# Patient Record
Sex: Male | Born: 1990 | Race: Black or African American | Hispanic: No | Marital: Single | Smoking: Never smoker
Health system: Southern US, Community
[De-identification: ages and names within clinical notes are randomized; demographics above are authoritative.]

## PROBLEM LIST (undated history)

## (undated) DIAGNOSIS — R079 Chest pain, unspecified: Secondary | ICD-10-CM

## (undated) DIAGNOSIS — I10 Essential (primary) hypertension: Secondary | ICD-10-CM

## (undated) HISTORY — DX: Essential (primary) hypertension: I10

## (undated) HISTORY — DX: Chest pain, unspecified: R07.9

---

## 2012-11-26 ENCOUNTER — Encounter (HOSPITAL_COMMUNITY): Payer: Self-pay | Admitting: Emergency Medicine

## 2012-11-26 ENCOUNTER — Emergency Department (HOSPITAL_COMMUNITY)
Admission: EM | Admit: 2012-11-26 | Discharge: 2012-11-26 | Disposition: A | Discharge status: Home / Self Care | Payer: BC Managed Care – PPO | Source: Home / Self Care | Attending: Emergency Medicine | Admitting: Emergency Medicine

## 2012-11-26 ENCOUNTER — Emergency Department (INDEPENDENT_AMBULATORY_CARE_PROVIDER_SITE_OTHER): Payer: BC Managed Care – PPO

## 2012-11-26 DIAGNOSIS — J209 Acute bronchitis, unspecified: Secondary | ICD-10-CM

## 2012-11-26 DIAGNOSIS — J208 Acute bronchitis due to other specified organisms: Secondary | ICD-10-CM

## 2012-11-26 MED ORDER — HYDROCOD POLST-CHLORPHEN POLST 10-8 MG/5ML PO LQCR: 5.0000 mL | Freq: Two times a day (BID) | ORAL | Status: DC | PRN

## 2012-11-26 MED ORDER — NAPROXEN 500 MG PO TABS: 500.0000 mg | ORAL_TABLET | Freq: Two times a day (BID) | ORAL | Status: DC

## 2012-11-26 MED ORDER — ALBUTEROL SULFATE HFA 108 (90 BASE) MCG/ACT IN AERS: 1.0000 | INHALATION_SPRAY | Freq: Four times a day (QID) | RESPIRATORY_TRACT | Status: DC | PRN

## 2012-11-26 MED ORDER — PREDNISONE 20 MG PO TABS: 20.0000 mg | ORAL_TABLET | Freq: Two times a day (BID) | ORAL | Status: DC

## 2012-11-26 NOTE — ED Provider Notes (Signed)
Chief Complaint:   Chief Complaint  Patient presents with  . Cough  . Dizziness    History of Present Illness:   Joe Mayer is a 22 year old male who has had a five-day history of cough productive of clear sputum, wheezing, gagging with cough but no vomiting, dizziness when walking or driving, chills, sweats, sneezing, headache, sore throat, and diarrhea with 3-4 loose stools per day. He denies any chest pain, nasal congestion, or vomiting. No sick exposures.  Review of Systems:  Other than noted above, the patient denies any of the following symptoms: Systemic:  No fevers, chills, sweats, weight loss or gain, fatigue, or tiredness. Eye:  No redness or discharge. ENT:  No ear pain, drainage, headache, nasal congestion, drainage, sinus pressure, difficulty swallowing, or sore throat. Neck:  No neck pain or swollen glands. Lungs:  No cough, sputum production, hemoptysis, wheezing, chest tightness, shortness of breath or chest pain. GI:  No abdominal pain, nausea, vomiting or diarrhea.  PMFSH:  Past medical history, family history, social history, meds, and allergies were reviewed. He's had high blood pressure in the past but is not taking any medication for right now.  Physical Exam:   Vital signs:  BP 134/89  Pulse 98  Temp(Src) 98.9 F (37.2 C) (Oral)  Resp 17  SpO2 97% General:  Alert and oriented.  In no distress.  Skin warm and dry. Eye:  No conjunctival injection or drainage. Lids were normal. ENT:  TMs and canals were normal, without erythema or inflammation.  Nasal mucosa was clear and uncongested, without drainage.  Mucous membranes were moist.  Pharynx was clear with no exudate but he does have some clear postnasal drainage.  There were no oral ulcerations or lesions. Neck:  Supple, no adenopathy, tenderness or mass. Lungs:  No respiratory distress.  Lungs were clear to auscultation, without wheezes, rales or rhonchi.  Breath sounds were clear and equal bilaterally.  Heart:   Regular rhythm, without gallops, murmers or rubs. Skin:  Clear, warm, and dry, without rash or lesions.  Radiology:  Dg Chest 2 View  11/26/2012   *RADIOLOGY REPORT*  Clinical Data: Productive cough for 5 days  CHEST - 2 VIEW  Comparison: None.  Findings: The heart, mediastinum and hila are within normal limits. The lungs are clear.  No pleural effusion or pneumothorax.  The bony thorax and surrounding soft tissues are unremarkable.  IMPRESSION: Normal chest radiographs.   Original Report Authenticated By: Amie Portland, M.D.   Assessment:  The encounter diagnosis was Viral bronchitis.  No indication for antibiotics.  Plan:   1.  The following meds were prescribed:   Discharge Medication List as of 11/26/2012  8:30 PM    START taking these medications   Details  albuterol (PROVENTIL HFA;VENTOLIN HFA) 108 (90 BASE) MCG/ACT inhaler Inhale 1-2 puffs into the lungs every 6 (six) hours as needed for wheezing., Starting 11/26/2012, Until Discontinued, Normal    chlorpheniramine-HYDROcodone (TUSSIONEX) 10-8 MG/5ML LQCR Take 5 mL by mouth every 12 (twelve) hours as needed., Starting 11/26/2012, Until Discontinued, Normal    naproxen (NAPROSYN) 500 MG tablet Take 1 tablet (500 mg total) by mouth 2 (two) times daily., Starting 11/26/2012, Until Discontinued, Normal    predniSONE (DELTASONE) 20 MG tablet Take 1 tablet (20 mg total) by mouth 2 (two) times daily., Starting 11/26/2012, Until Discontinued, Normal       2.  The patient was instructed in symptomatic care and handouts were given. 3.  The patient was told to  return if becoming worse in any way, if no better in 3 or 4 days, and given some red flag symptoms such as fever, difficulty breathing, or pain that would indicate earlier return. 4.  Follow up here if no better in 3 or 4 days.      Reuben Likes, MD 11/26/12 [phone]

## 2012-11-26 NOTE — ED Notes (Signed)
C/o cough and dizziness since Friday.  States throughout the day he has SOB.  This morning patient nosed started bleeding so he decided to come on in.  Patient nose has bleed before when younger.   OTC medication taken but no relief.

## 2013-04-29 ENCOUNTER — Encounter (HOSPITAL_COMMUNITY): Payer: Self-pay | Admitting: Emergency Medicine

## 2013-04-29 ENCOUNTER — Emergency Department (HOSPITAL_COMMUNITY)
Admission: EM | Admit: 2013-04-29 | Discharge: 2013-04-29 | Disposition: A | Discharge status: Home / Self Care | Payer: BC Managed Care – PPO | Source: Home / Self Care | Attending: Family Medicine | Admitting: Family Medicine

## 2013-04-29 ENCOUNTER — Emergency Department (INDEPENDENT_AMBULATORY_CARE_PROVIDER_SITE_OTHER): Payer: BC Managed Care – PPO

## 2013-04-29 DIAGNOSIS — J40 Bronchitis, not specified as acute or chronic: Secondary | ICD-10-CM

## 2013-04-29 MED ORDER — ALBUTEROL SULFATE HFA 108 (90 BASE) MCG/ACT IN AERS: 2.0000 | INHALATION_SPRAY | Freq: Four times a day (QID) | RESPIRATORY_TRACT | Status: DC | PRN

## 2013-04-29 MED ORDER — PREDNISONE 10 MG PO TABS: 30.0000 mg | ORAL_TABLET | Freq: Every day | ORAL | Status: DC

## 2013-04-29 MED ORDER — IPRATROPIUM BROMIDE 0.02 % IN SOLN
RESPIRATORY_TRACT | Status: AC
  Filled 2013-04-29: qty 2.5

## 2013-04-29 MED ORDER — IPRATROPIUM-ALBUTEROL 0.5-2.5 (3) MG/3ML IN SOLN
3.0000 mL | Freq: Once | RESPIRATORY_TRACT | Status: AC
  Administered 2013-04-29: 3 mL via RESPIRATORY_TRACT

## 2013-04-29 MED ORDER — ALBUTEROL SULFATE (2.5 MG/3ML) 0.083% IN NEBU
INHALATION_SOLUTION | RESPIRATORY_TRACT | Status: AC
  Filled 2013-04-29: qty 3

## 2013-04-29 NOTE — ED Notes (Signed)
Pt reports  Symptoms  Of  Chest  Wall  Pain  When he  Takes    A  Deep breath            He  Also  Reports  A  Headache      And  Reports  His  bp  Has  Been  High  Recently                 He  Is  Sitting  Upright on the  Exam table  Speaking in  Complete  sentances  And  Is  In no  Acute  Distress

## 2013-04-29 NOTE — ED Provider Notes (Signed)
Joe CornfieldChase Mayer is a 23 y.o. male who presents to Urgent Care today for chest pain. Patient notes 2 days of central chest pain. The pain does not radiate. The pain is worse with deep inspiration and coughing.  The pain is mild and not exertional. He denies any shortness of breath. He does note some central chest congestion that is similar to previous bronchitis. He [place] tub[employer] [place] [employer].  No nausea vomiting or diarrhea.  He additionally notes a two-month headache. He denies any weakness numbness loss of function trouble breathing or swallowing. He notes the pain is present on most days. It is not worse or better with anything. The pain is mild. He has tried multiple over-the-counter medications for both problems which have helped a little.   History reviewed. No pertinent past medical history. History  Substance Use Topics  . Smoking status: Never Smoker   . Smokeless tobacco: Not on file  . Alcohol Use: No   ROS as above Medications: No current facility-administered medications for this encounter.   Current Outpatient Prescriptions  Medication Sig Dispense Refill  . albuterol (PROVENTIL HFA;VENTOLIN HFA) 108 (90 BASE) MCG/ACT inhaler Inhale 2 puffs into the lungs every 6 (six) hours as needed for wheezing or shortness of breath.  1 Inhaler  2  . predniSONE (DELTASONE) 10 MG tablet Take 3 tablets (30 mg total) by mouth daily.  15 tablet  0    Exam:  BP 127/50  Pulse 62  Temp(Src) 98.7 F (37.1 C) (Oral)  Resp 16  SpO2 100% Gen: Well NAD HEENT: EOMI,  MMM, PERRLA Lungs: Normal work of breathing. CTABL Heart: RRR no MRG Chest Wall: Nontender.  Abd: NABS, Soft. NT, ND Exts: Brisk capillary refill, warm and well perfused.  Neuro: Alert and oriented normal coordination strength gait and balance  Patient was given a DuoNeb nebulizer treatment and had considerable improvement in symptoms  No results found for this or any previous visit (from the past 24  hour(s)). Dg Chest 2 View  04/29/2013   CLINICAL DATA:  Chest pain for the past 2 days.  EXAM: CHEST  2 VIEW  COMPARISON:  Chest x-ray 11/26/2012.  FINDINGS: Lung volumes are normal. No consolidative airspace disease. No pleural effusions. No pneumothorax. No pulmonary nodule or mass noted. Pulmonary vasculature and the cardiomediastinal silhouette are within normal limits. Axial No radiographic evidence of acute cardiopulmonary disease.  IMPRESSION: No active cardiopulmonary disease.   Electronically Signed   By: Trudie Reedaniel  Entrikin M.D.   On: 04/29/2013 17:32    Assessment and Plan: 23 y.o. male with bronchitis with pleuritis. Plan to treat with prednisone, and albuterol. Followup with primary care provider.  Patient also has a chronic headache. He has no abnormal neurological symptoms or physical exam findings.  Recommend followup with primary care provider. This may be medication rebound headache.  Discussed warning signs or symptoms. Please see discharge instructions. Patient expresses understanding.    Rodolph BongEvan S Leeandre Nordling, MD 04/29/13 [phone]

## 2013-04-29 NOTE — Discharge Instructions (Signed)
Thank you for coming in today. Take prednisone daily for 5 days. Use albuterol as needed for shortness of breath. Take up to 2 Aleve twice daily. If not getting better please followup back here, or at your primary care office. Followup with your primary care provider for headache. Call or go to the emergency room if you get worse, have trouble breathing, have chest pains, or palpitations.  Go to the emergency room if your headache becomes excruciating or you have weakness or numbness or uncontrolled vomiting.   Bronchitis Bronchitis is inflammation of the airways that extend from the windpipe into the lungs (bronchi). The inflammation often causes mucus to develop, which leads to a cough. If the inflammation becomes severe, it may cause shortness of breath. CAUSES  Bronchitis may be caused by:   Viral infections.   Bacteria.   Cigarette smoke.   Allergens, pollutants, and other irritants.  SIGNS AND SYMPTOMS  The most common symptom of bronchitis is a frequent cough that produces mucus. Other symptoms include:  Fever.   Body aches.   Chest congestion.   Chills.   Shortness of breath.   Sore throat.  DIAGNOSIS  Bronchitis is usually diagnosed through a medical history and physical exam. Tests, such as chest X-rays, are sometimes done to rule out other conditions.  TREATMENT  You may need to avoid contact with whatever caused the problem (smoking, for example). Medicines are sometimes needed. These may include:  Antibiotics. These may be prescribed if the condition is caused by bacteria.  Cough suppressants. These may be prescribed for relief of cough symptoms.   Inhaled medicines. These may be prescribed to help open your airways and make it easier for you to breathe.   Steroid medicines. These may be prescribed for those with recurrent (chronic) bronchitis. HOME CARE INSTRUCTIONS  Get plenty of rest.   Drink enough fluids to keep your urine clear or  pale yellow (unless you have a medical condition that requires fluid restriction). Increasing fluids may help thin your secretions and will prevent dehydration.   Only take over-the-counter or prescription medicines as directed by your health care provider.  Only take antibiotics as directed. Make sure you finish them even if you start to feel better.  Avoid secondhand smoke, irritating chemicals, and strong fumes. These will make bronchitis worse. If you are a smoker, quit smoking. Consider using nicotine gum or skin patches to help control withdrawal symptoms. Quitting smoking will help your lungs heal faster.   Put a cool-mist humidifier in your bedroom at night to moisten the air. This may help loosen mucus. Change the water in the humidifier daily. You can also run the hot water in your shower and sit in the bathroom with the door closed for 5 10 minutes.   Follow up with your health care provider as directed.   Wash your hands frequently to avoid catching bronchitis again or spreading an infection to others.  SEEK MEDICAL CARE IF: Your symptoms do not improve after 1 week of treatment.  SEEK IMMEDIATE MEDICAL CARE IF:  Your fever increases.  You have chills.   You have chest pain.   You have worsening shortness of breath.   You have bloody sputum.  You faint.  You have lightheadedness.  You have a severe headache.   You vomit repeatedly. MAKE SURE YOU:   Understand these instructions.  Will watch your condition.  Will get help right away if you are not doing well or get worse. Document Released:  03/28/2005 Document Revised: 01/16/2013 Document Reviewed: 11/20/2012 [facility] Patient Information 2014 [employer] [place].

## 2014-12-21 IMAGING — CR DG CHEST 2V
3 series · 3 of 3 positions shown · non-contrast
Comparison: None.

CLINICAL DATA: Productive cough for 5 days

CHEST - 2 VIEW

[view not recorded (1 of 3)]
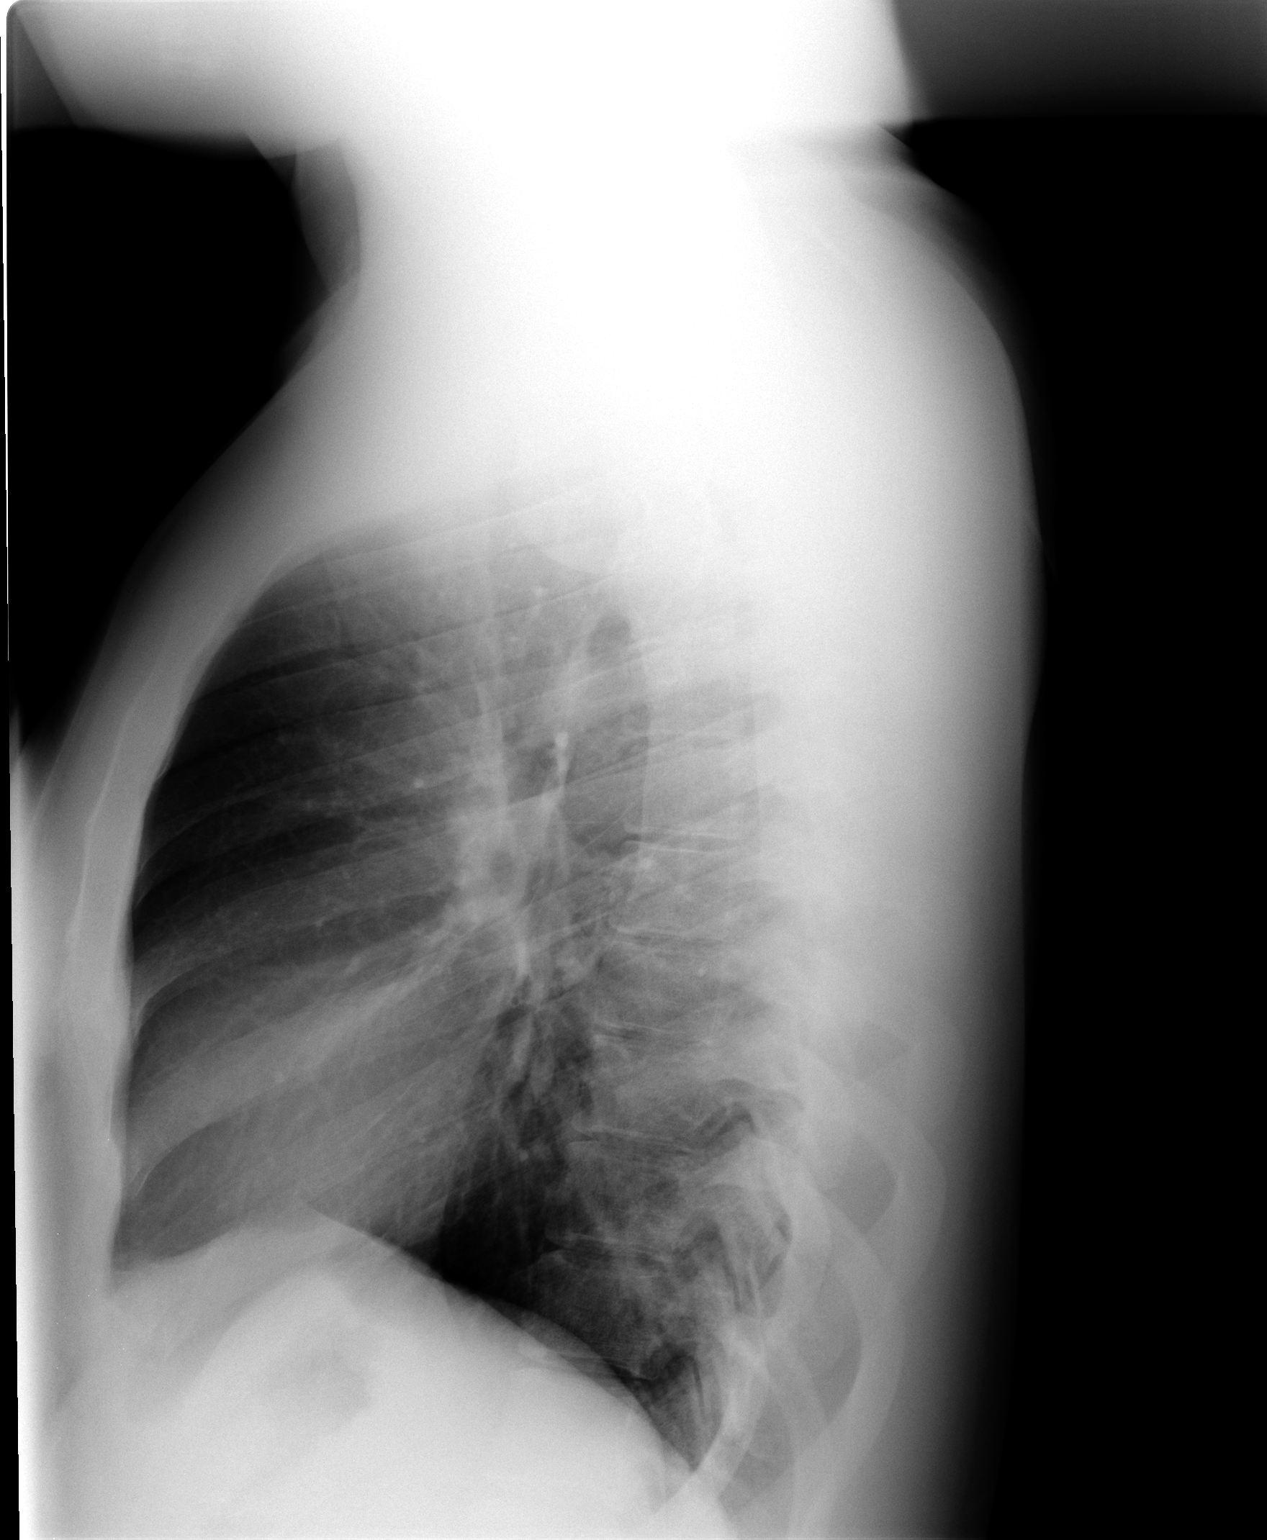

[view not recorded (2 of 3)]
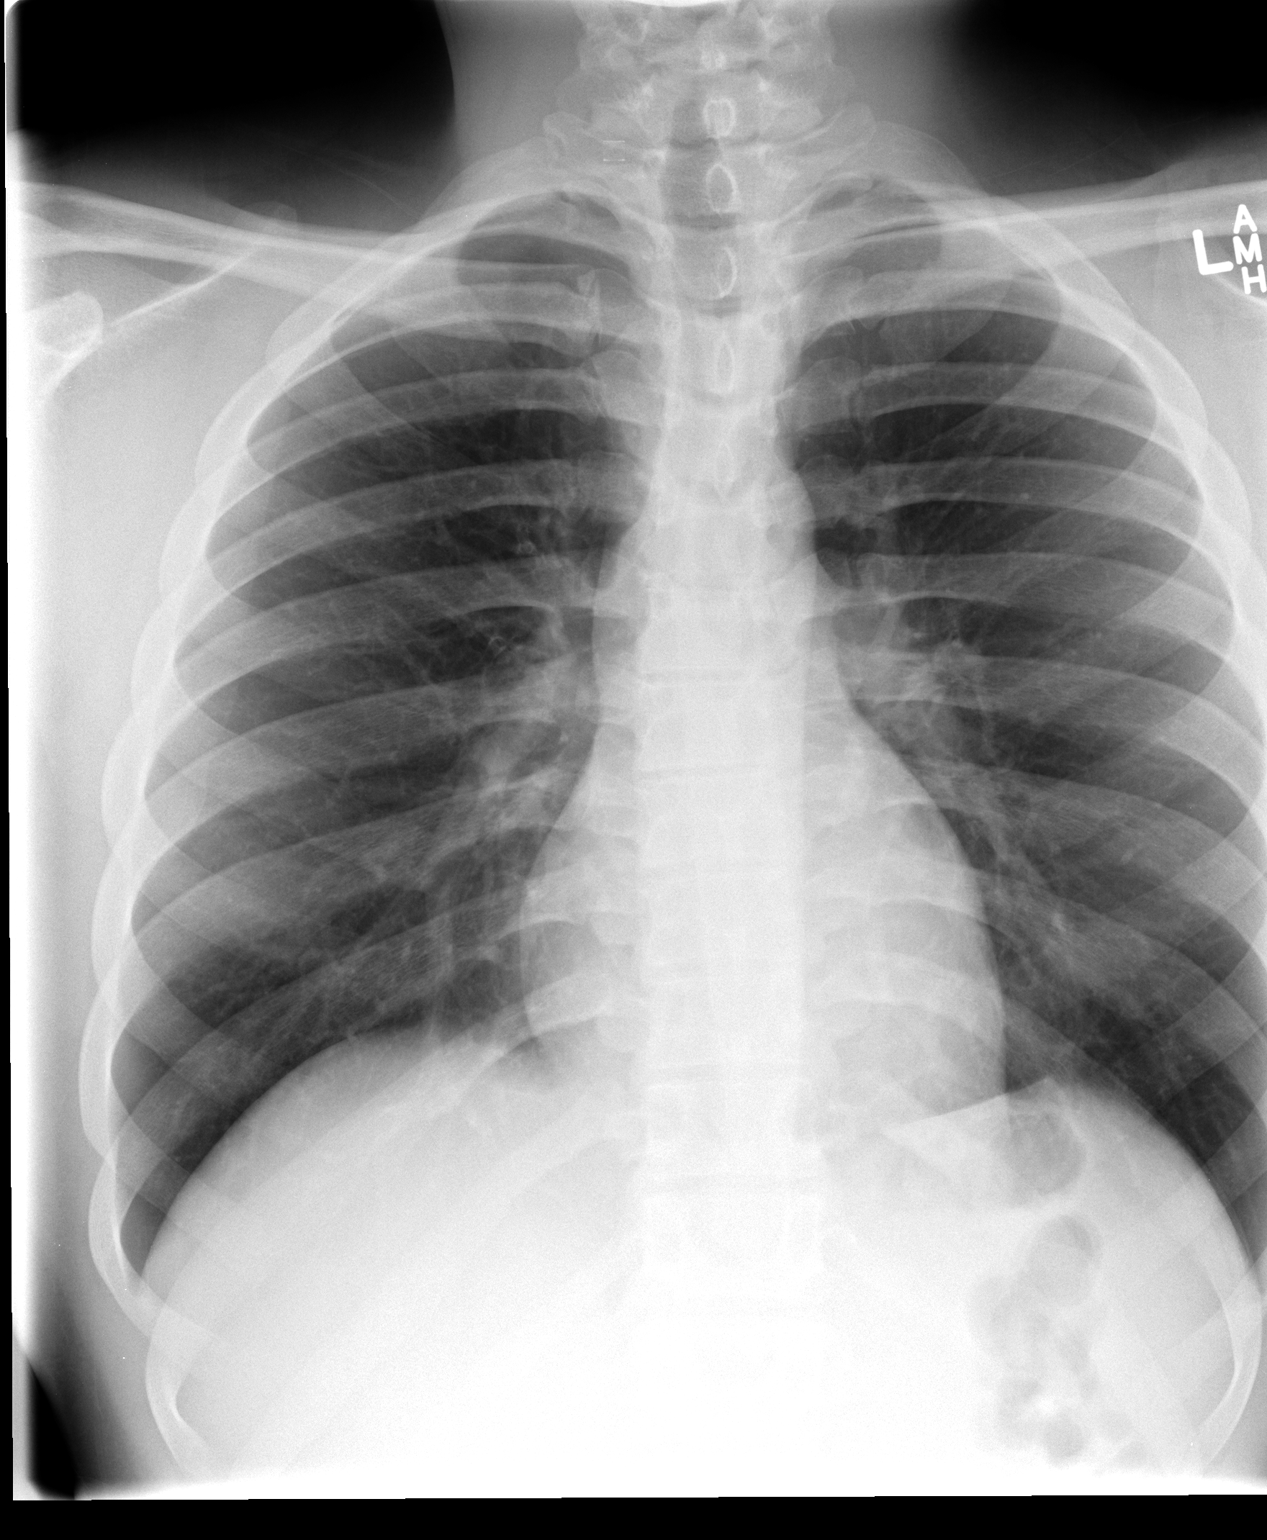

[view not recorded (3 of 3)]
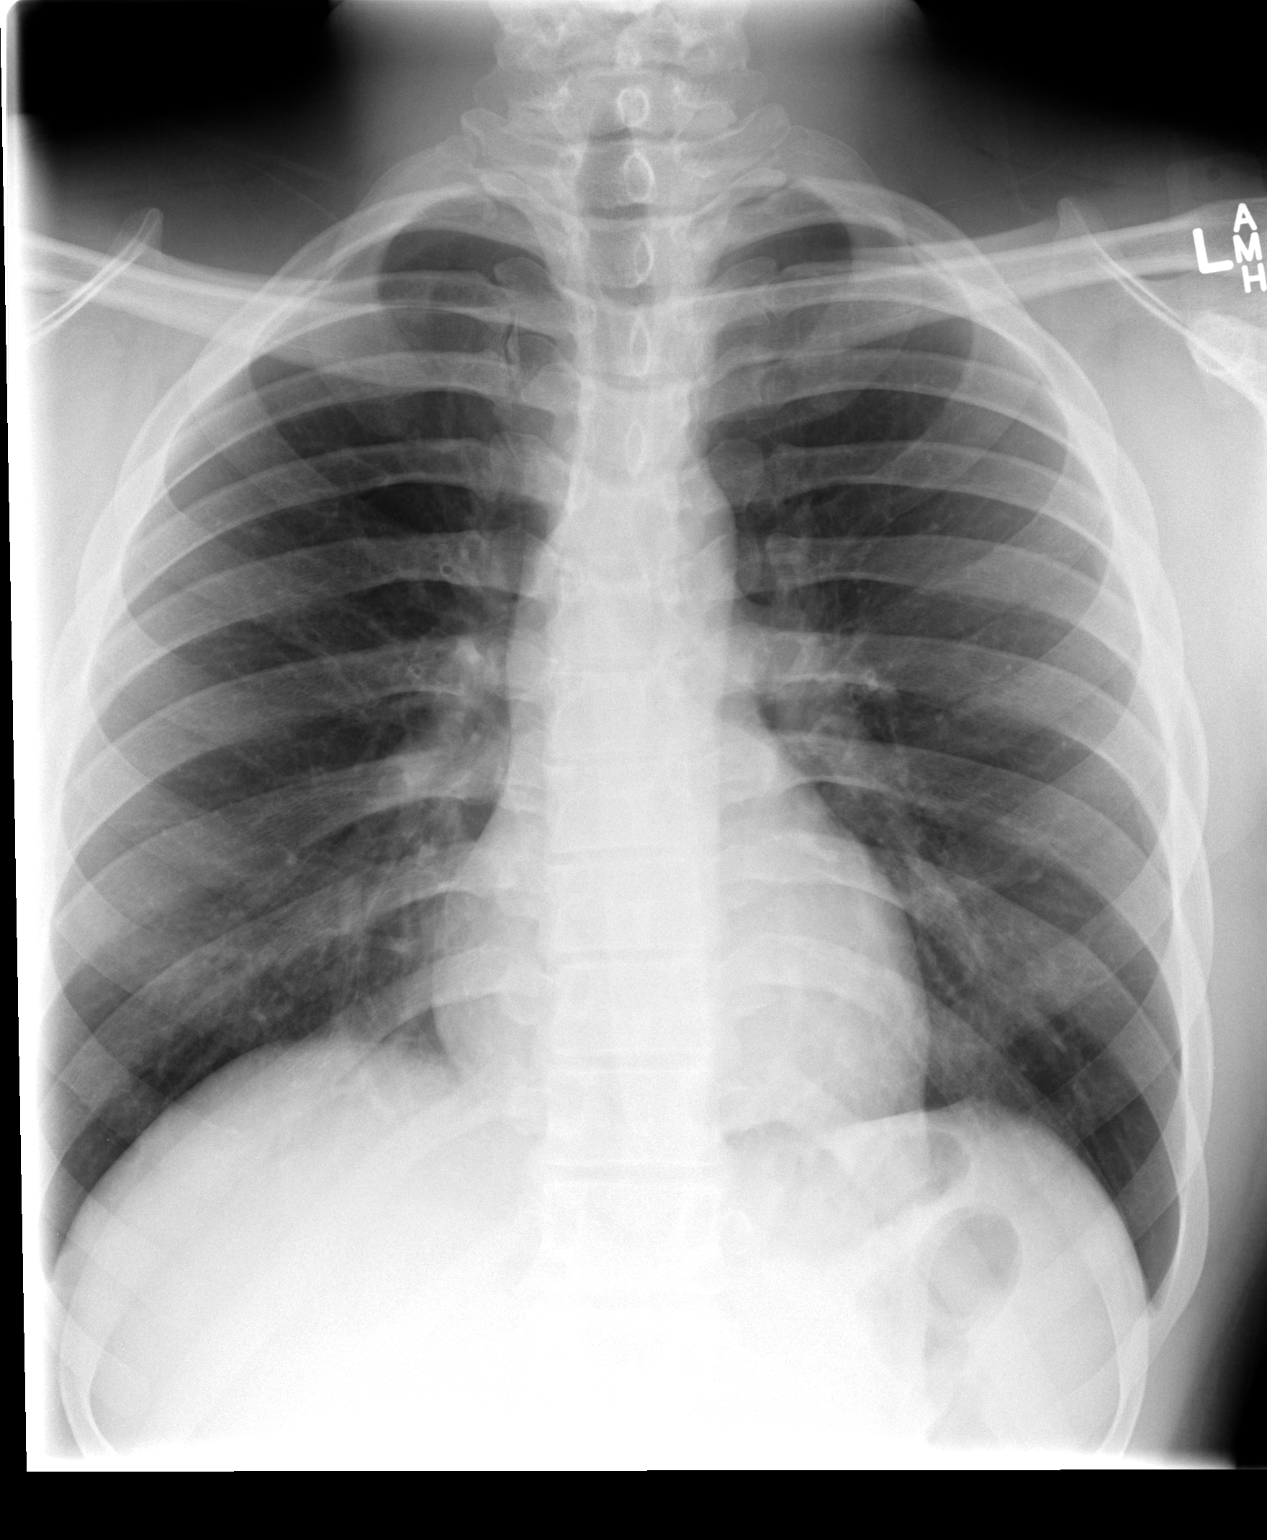

[3 of 3 positions shown; findings below may reference images not displayed]

FINDINGS: The heart, mediastinum and hila are within normal limits.
The lungs are clear.  No pleural effusion or pneumothorax.

The bony thorax and surrounding soft tissues are unremarkable.
IMPRESSION: Normal chest radiographs.

## 2015-05-24 IMAGING — CR DG CHEST 2V
2 series · 2 of 2 positions shown · non-contrast
Comparison: Chest x-ray 11/26/2012.

CLINICAL DATA: Chest pain for the past 2 days.

EXAM:
CHEST  2 VIEW

[view not recorded (1 of 2)]
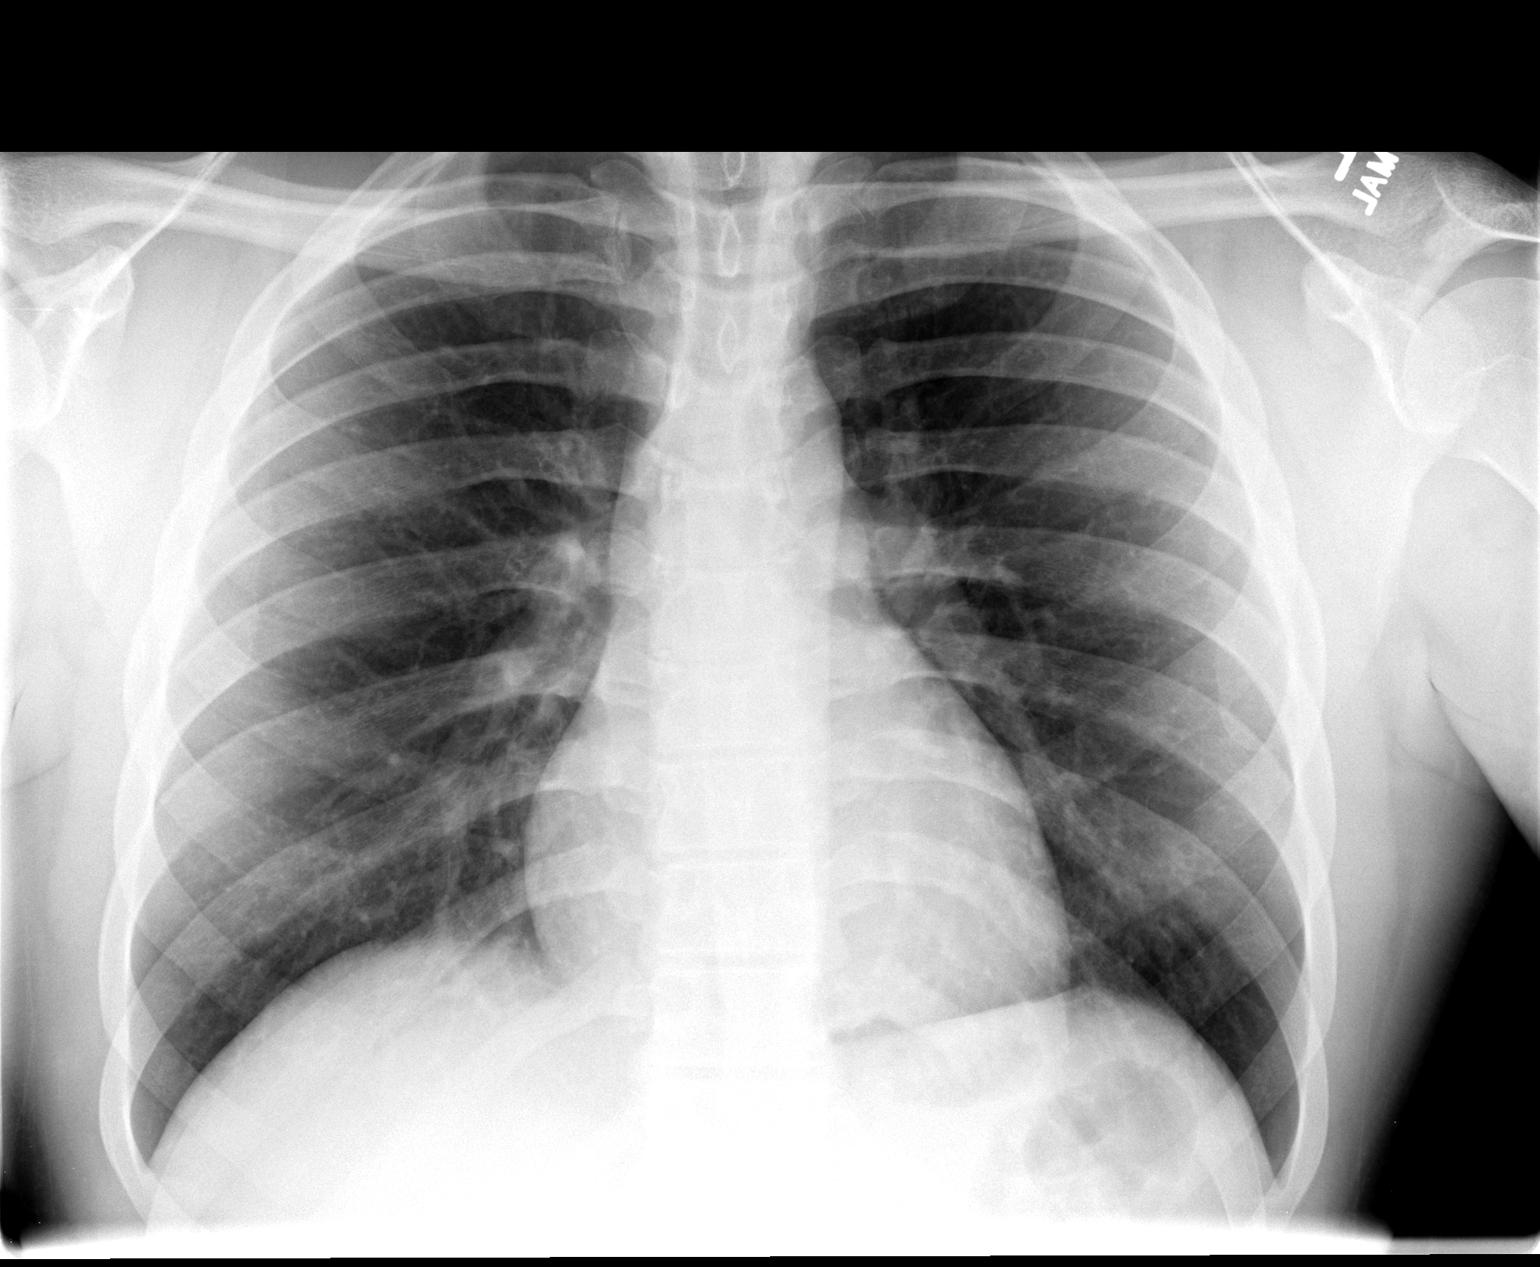

[view not recorded (2 of 2)]
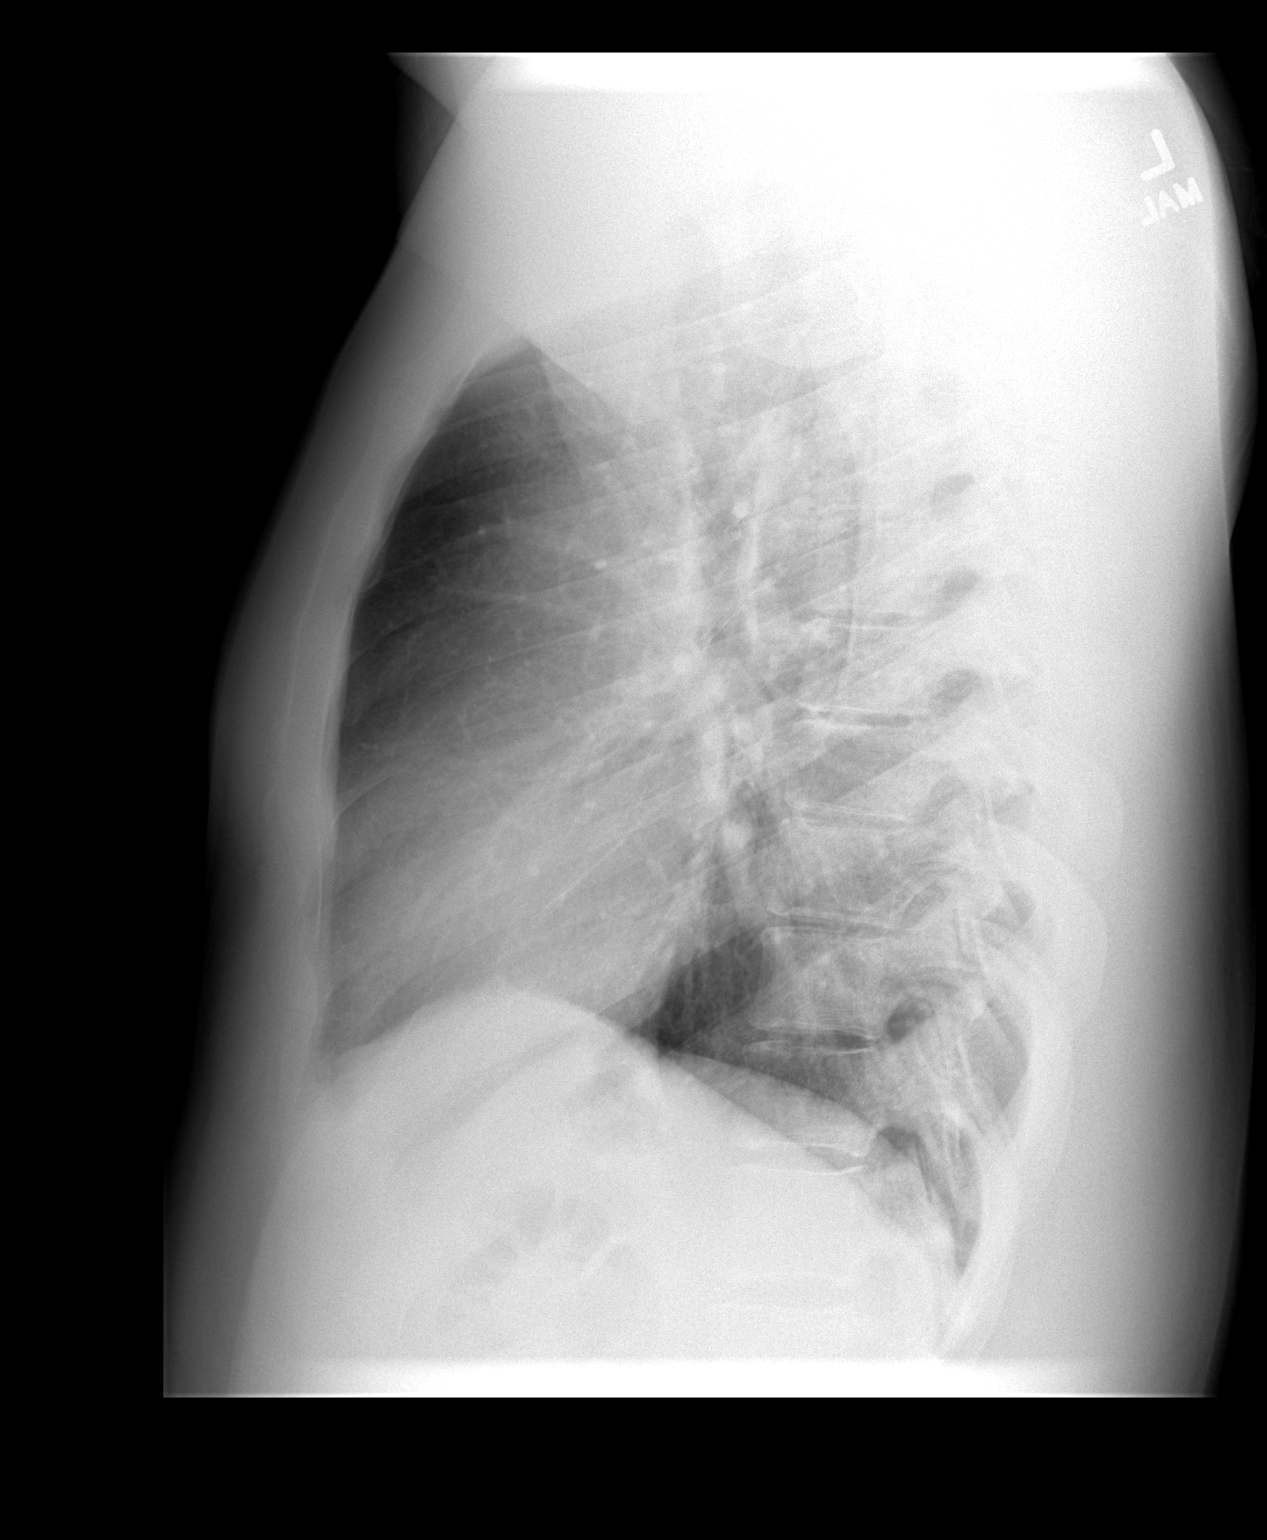

[2 of 2 positions shown; findings below may reference images not displayed]

FINDINGS: Lung volumes are normal. No consolidative airspace disease. No
pleural effusions. No pneumothorax. No pulmonary nodule or mass
noted. Pulmonary vasculature and the cardiomediastinal silhouette
are within normal limits. Axial No radiographic evidence of acute
cardiopulmonary disease.
IMPRESSION: No active cardiopulmonary disease.

## 2021-03-13 ENCOUNTER — Ambulatory Visit (HOSPITAL_COMMUNITY)
Admission: EM | Admit: 2021-03-13 | Discharge: 2021-03-13 | Disposition: A | Discharge status: Home / Self Care | Payer: BC Managed Care – PPO | Attending: Physician Assistant | Admitting: Physician Assistant

## 2021-03-13 ENCOUNTER — Other Ambulatory Visit: Payer: Self-pay

## 2021-03-13 ENCOUNTER — Encounter (HOSPITAL_COMMUNITY): Payer: Self-pay

## 2021-03-13 DIAGNOSIS — J039 Acute tonsillitis, unspecified: Secondary | ICD-10-CM | POA: Insufficient documentation

## 2021-03-13 DIAGNOSIS — J029 Acute pharyngitis, unspecified: Secondary | ICD-10-CM | POA: Insufficient documentation

## 2021-03-13 LAB — POCT INFECTIOUS MONO SCREEN, ED / UC: Mono Screen: NEGATIVE

## 2021-03-13 LAB — POCT RAPID STREP A, ED / UC: Streptococcus, Group A Screen (Direct): NEGATIVE

## 2021-03-13 MED ORDER — AMOXICILLIN-POT CLAVULANATE 400-57 MG/5ML PO SUSR
875.0000 mg | Freq: Two times a day (BID) | ORAL | 0 refills | Status: AC
Start: 2021-03-13 — End: 2021-03-23

## 2021-03-13 MED ORDER — CEFTRIAXONE SODIUM 1 G IJ SOLR
INTRAMUSCULAR | Status: AC
Start: 2021-03-13 — End: ?
  Filled 2021-03-13: qty 10

## 2021-03-13 MED ORDER — LIDOCAINE HCL (PF) 1 % IJ SOLN
INTRAMUSCULAR | Status: AC
  Filled 2021-03-13: qty 4

## 2021-03-13 MED ORDER — CEFTRIAXONE SODIUM 1 G IJ SOLR
1.0000 g | Freq: Once | INTRAMUSCULAR | Status: AC
  Administered 2021-03-13: 1 g via INTRAMUSCULAR

## 2021-03-13 MED ORDER — PREDNISONE 20 MG PO TABS: 40.0000 mg | ORAL_TABLET | Freq: Every day | ORAL | 0 refills | Status: AC

## 2021-03-13 NOTE — ED Provider Notes (Signed)
[facility]    CSN: [id number] Arrival date & time: 03/13/21  1734      History   Chief Complaint Chief Complaint  Patient presents with   Sore Throat    HPI Joe Mayer is a 30 y.o. male.   Patient presents today with a 2-day history of worsening sore throat.  He reports pain is rated 8 on a 0-10 pain scale, localized to posterior oropharynx without radiation, described as sharp, worse with swallowing, no alleviating factors identified.  He has tried over-the-counter medication without improvement of symptoms.  Denies any known sick contacts.  Denies any recent antibiotic use.  He does report that his voice is slightly muffled and he is having difficulty with swallowing due to pain.  He has been able to eat and drink and is managing his own secretions.  Denies any known fever, cough, nasal congestion, nausea, vomiting.  Reports symptoms are severe and he is having difficulty with daily activities.   Past Medical History:  Diagnosis Date   Chest pain    Hypertension     There are no problems to display for this patient.   History reviewed. No pertinent surgical history.     Home Medications    Prior to Admission medications   Medication Sig Start Date End Date Taking? Authorizing Provider  amoxicillin-clavulanate (AUGMENTIN) 400-57 MG/5ML suspension Take 10.9 mLs (875 mg total) by mouth 2 (two) times daily for 10 days. 03/13/21 03/23/21 Yes Anida Deol, Denny Peon K, PA-C  predniSONE (DELTASONE) 20 MG tablet Take 2 tablets (40 mg total) by mouth daily for 4 days. 03/13/21 03/17/21 Yes Victorya Hillman K, PA-C  albuterol (PROVENTIL HFA;VENTOLIN HFA) 108 (90 BASE) MCG/ACT inhaler Inhale 2 puffs into the lungs every 6 (six) hours as needed for wheezing or shortness of breath. 04/29/13   Rodolph Bong, MD  lisinopril (PRINIVIL,ZESTRIL) 10 MG tablet Take 10 mg by mouth daily.    [provider]    Family History History reviewed. No pertinent family history.  Social  History Social History   Tobacco Use   Smoking status: Never  Substance Use Topics   Alcohol use: No     Allergies   Patient has no known allergies.   Review of Systems Review of Systems  Constitutional:  Positive for activity change, appetite change and fatigue. Negative for fever.  HENT:  Positive for sore throat, trouble swallowing and voice change. Negative for congestion, sinus pressure and sneezing.   Respiratory:  Negative for cough and shortness of breath.   Cardiovascular:  Negative for chest pain.  Gastrointestinal:  Negative for abdominal pain, diarrhea, nausea and vomiting.  Neurological:  Negative for dizziness, light-headedness and headaches.    Physical Exam Triage Vital Signs ED Triage Vitals  Enc Vitals Group     BP 03/13/21 1815 127/90     Pulse Rate 03/13/21 1815 (!) 125     Resp 03/13/21 1815 18     Temp 03/13/21 1815 99.1 F (37.3 C)     Temp Source 03/13/21 1815 Oral     SpO2 03/13/21 1815 100 %     Weight --      Height --      Head Circumference --      Peak Flow --      Pain Score 03/13/21 1814 8     Pain Loc --      Pain Edu? --      Excl. in GC? --    No data found.  Updated Vital Signs BP 127/90 (BP Location: Right Arm)   Pulse (!) 125   Temp 99.1 F (37.3 C) (Oral)   Resp 18   SpO2 100%   Visual Acuity Right Eye Distance:   Left Eye Distance:   Bilateral Distance:    Right Eye Near:   Left Eye Near:    Bilateral Near:     Physical Exam Vitals reviewed.  Constitutional:      General: He is awake.     Appearance: Normal appearance. He is well-developed. He is not ill-appearing.     Comments: Very pleasant male appears stated age in no acute distress sitting comfortably in exam room  HENT:     Head: Normocephalic and atraumatic.     Right Ear: Tympanic membrane, ear canal and external ear normal. Tympanic membrane is not erythematous or bulging.     Left Ear: Tympanic membrane, ear canal and external ear normal.  Tympanic membrane is not erythematous or bulging.     Nose: Nose normal.     Mouth/Throat:     Pharynx: Uvula midline. Posterior oropharyngeal erythema present. No oropharyngeal exudate.     Tonsils: No tonsillar exudate or tonsillar abscesses.     Comments: Significant erythema posterior oropharynx.  Strained voice. Cardiovascular:     Rate and Rhythm: Normal rate and regular rhythm.     Heart sounds: Normal heart sounds, S1 normal and S2 normal. No murmur heard. Pulmonary:     Effort: Pulmonary effort is normal. No accessory muscle usage or respiratory distress.     Breath sounds: Normal breath sounds. No stridor. No wheezing, rhonchi or rales.     Comments: Clear to auscultation bilaterally Abdominal:     General: Bowel sounds are normal.     Palpations: Abdomen is soft.     Tenderness: There is no abdominal tenderness.  Lymphadenopathy:     Head:     Right side of head: No submental, submandibular or tonsillar adenopathy.     Left side of head: No submental, submandibular or tonsillar adenopathy.     Cervical: No cervical adenopathy.  Neurological:     Mental Status: He is alert.  Psychiatric:        Behavior: Behavior is cooperative.     UC Treatments / Results  Labs (all labs ordered are listed, but only abnormal results are displayed) Labs Reviewed  CULTURE, GROUP A STREP [facility])  POCT RAPID STREP A, ED / UC  POCT INFECTIOUS MONO SCREEN, ED / UC    EKG   Radiology No results found.  Procedures Procedures (including critical care time)  Medications Ordered in UC Medications  cefTRIAXone (ROCEPHIN) injection 1 g (1 g Intramuscular Given 03/13/21 1853)    Initial Impression / Assessment and Plan / UC Course  I have reviewed the triage vital signs and the nursing notes.  Pertinent labs & imaging results that were available during my care of the patient were reviewed by me and considered in my medical decision making (see chart for details).     Rapid strep  and mono testing were negative in clinic today.  Concern for beginning of tonsillar abscess given voice change with sudden worsening of symptoms.  Patient was given 1 g of Rocephin in clinic and started on Augmentin.  He was given prednisone to help with pain and inflammation with instruction not to take NSAIDs with this medication due to risk of GI bleeding.  Discussed that if symptoms are not improving but are also not  worsening he should follow-up with ENT next week.  Discussed at length that if he has any worsening symptoms including worsening voice changes, inability to swallow, feeling like his throat/tongue swelling, shortness of breath he needs to go to the emergency room.  Strict return precautions given to which he expressed understanding.  Final Clinical Impressions(s) / UC Diagnoses   Final diagnoses:  Acute tonsillitis, unspecified etiology  Sore throat     Discharge Instructions      Your strep and mono were negative in clinic today.  I am concerned for an infection.  Please start Augmentin twice daily.  I would also like to start prednisone to help with the pain and inflammation.  You should not take NSAIDs with prednisone as it can cause stomach bleeding which includes aspirin, ibuprofen/Advil, naproxen/Aleve.  If your symptoms are improving but have not resolved follow-up with ear nose and throat doctor by calling them to schedule an appointment on Monday.  As we discussed, if you have any worsening symptoms including difficulty speaking, difficulty swallowing, shortness of breath feeling like you are throat is swelling you need to go to the emergency room.    ED Prescriptions     Medication Sig Dispense Auth. Provider   amoxicillin-clavulanate (AUGMENTIN) 400-57 MG/5ML suspension Take 10.9 mLs (875 mg total) by mouth 2 (two) times daily for 10 days. 220 mL Joe Mayer K, PA-C   predniSONE (DELTASONE) 20 MG tablet Take 2 tablets (40 mg total) by mouth daily for 4 days. 8 tablet  Joe Mayer, Noberto Retort, PA-C      PDMP not reviewed this encounter.   Jeani Hawking, PA-C 03/13/21 1903

## 2021-03-13 NOTE — ED Triage Notes (Signed)
Pt presents with c/o possible strep throat X 2 days. States he has loss of voice and soreness in throat.

## 2021-03-13 NOTE — Discharge Instructions (Signed)
Your strep and mono were negative in clinic today.  I am concerned for an infection.  Please start Augmentin twice daily.  I would also like to start prednisone to help with the pain and inflammation.  You should not take NSAIDs with prednisone as it can cause stomach bleeding which includes aspirin, ibuprofen/Advil, naproxen/Aleve.  If your symptoms are improving but have not resolved follow-up with ear nose and throat doctor by calling them to schedule an appointment on Monday.  As we discussed, if you have any worsening symptoms including difficulty speaking, difficulty swallowing, shortness of breath feeling like you are throat is swelling you need to go to the emergency room.

## 2021-03-16 LAB — CULTURE, GROUP A STREP (THRC)

## 2023-06-19 ENCOUNTER — Encounter (HOSPITAL_COMMUNITY): Payer: Self-pay | Admitting: Emergency Medicine

## 2023-06-19 ENCOUNTER — Ambulatory Visit (HOSPITAL_COMMUNITY)
Admission: EM | Admit: 2023-06-19 | Discharge: 2023-06-19 | Disposition: A | Discharge status: Home / Self Care | Attending: Internal Medicine | Admitting: Internal Medicine

## 2023-06-19 ENCOUNTER — Ambulatory Visit (INDEPENDENT_AMBULATORY_CARE_PROVIDER_SITE_OTHER)

## 2023-06-19 DIAGNOSIS — J40 Bronchitis, not specified as acute or chronic: Secondary | ICD-10-CM

## 2023-06-19 DIAGNOSIS — R051 Acute cough: Secondary | ICD-10-CM

## 2023-06-19 MED ORDER — AZITHROMYCIN 250 MG PO TABS: ORAL_TABLET | ORAL | 0 refills | Status: AC

## 2023-06-19 MED ORDER — ALBUTEROL SULFATE HFA 108 (90 BASE) MCG/ACT IN AERS: 2.0000 | INHALATION_SPRAY | Freq: Four times a day (QID) | RESPIRATORY_TRACT | 0 refills | Status: AC | PRN

## 2023-06-19 NOTE — ED Provider Notes (Signed)
 [facility]    CSN: [id number] Arrival date & time: 06/19/23  1736      History   Chief Complaint Chief Complaint  Patient presents with   Cough    HPI Joe Mayer is a 33 y.o. male who presents with persistent productive cough since he was diagnosed with covid 3 weeks ago. States his R back hurts when he coughs. Has been wheezing and SOB. His cough is productive with yellow mucous. Is prone to getting bronchitis. Has not had a fever. Has had sweats off and on since done with Covid infection.     Past Medical History:  Diagnosis Date   Chest pain    Hypertension     There are no active problems to display for this patient.   History reviewed. No pertinent surgical history.     Home Medications    Prior to Admission medications   Medication Sig Start Date End Date Taking? Authorizing Provider  azithromycin (ZITHROMAX Z-PAK) 250 MG tablet 2 today, then 0ne every day x 4 days 06/19/23  Yes Rodriguez-Southworth, Nettie Elm, PA-C  albuterol (VENTOLIN HFA) 108 (90 Base) MCG/ACT inhaler Inhale 2 puffs into the lungs every 6 (six) hours as needed for wheezing or shortness of breath. 06/19/23   Rodriguez-Southworth, Nettie Elm, PA-C    Family History History reviewed. No pertinent family history.  Social History Social History   Tobacco Use   Smoking status: Never  Substance Use Topics   Alcohol use: No     Allergies   Patient has no known allergies.   Review of Systems Review of Systems As noted in HPI  Physical Exam Triage Vital Signs ED Triage Vitals  Encounter Vitals Group     BP 06/19/23 1923 134/77     Systolic BP Percentile --      Diastolic BP Percentile --      Pulse Rate 06/19/23 1923 88     Resp 06/19/23 1923 14     Temp 06/19/23 1923 98.5 F (36.9 C)     Temp Source 06/19/23 1923 Oral     SpO2 06/19/23 1923 98 %     Weight --      Height --      Head Circumference --      Peak Flow --      Pain Score 06/19/23 1921 4     Pain Loc  --      Pain Education --      Exclude from Growth Chart --    No data found.  Updated Vital Signs BP 134/77 (BP Location: Left Arm)   Pulse 88   Temp 98.5 F (36.9 C) (Oral)   Resp 14   SpO2 98%   Visual Acuity Right Eye Distance:   Left Eye Distance:   Bilateral Distance:    Right Eye Near:   Left Eye Near:    Bilateral Near:     Physical Exam Physical Exam Constitutional:      General: He is not in acute distress.    Appearance: He is not toxic-appearing.  HENT:     Head: Normocephalic.     Right Ear: Tympanic membrane, ear canal and external ear normal.     Left Ear: Ear canal and external ear normal.     Nose: Nose normal.     Mouth/Throat:     Mouth: Mucous membranes are moist.     Pharynx: Oropharynx is clear.  Eyes:     General: No scleral icterus.  Conjunctiva/sclera: Conjunctivae normal.  Cardiovascular:     Rate and Rhythm: Normal rate and regular rhythm.     Heart sounds: No murmur heard.   Pulmonary:     Effort: Pulmonary effort is normal. No respiratory distress.     Breath sounds: Clear, normal egophony. No chest wall tenderness when palpated on area of pain.  Musculoskeletal:        General: Normal range of motion.     Cervical back: Neck supple.  Lymphadenopathy:     Cervical: No cervical adenopathy.  Skin:    General: Skin is warm and dry.     Findings: No rash.  Neurological:     Mental Status: He is alert and oriented to person, place, and time.     Gait: Gait normal.  Psychiatric:        Mood and Affect: Mood normal.        Behavior: Behavior normal.        Thought Content: Thought content normal.        Judgment: Judgment normal.    UC Treatments / Results  Labs (all labs ordered are listed, but only abnormal results are displayed) Labs Reviewed - No data to display  EKG   Radiology No results found. CXR read by me and compared to 2 of his prior chest xrays and looks the same.  Final read is pending.   Procedures Procedures (including critical care time)  Medications Ordered in UC Medications - No data to display  Initial Impression / Assessment and Plan / UC Course  I have reviewed the triage vital signs and the nursing notes.  Pertinent  imaging results that were available during my care of the patient were reviewed by me and considered in my medical decision making (see chart for details).  Acute Bronchitis  I placed him on Zpack and Albuterol inhaler as noted. We will call him if the final radiology read is different than what I saw today.    Final Clinical Impressions(s) / UC Diagnoses   Final diagnoses:  Acute cough  Bronchitis   Discharge Instructions   None    ED Prescriptions     Medication Sig Dispense Auth. Provider   albuterol (VENTOLIN HFA) 108 (90 Base) MCG/ACT inhaler Inhale 2 puffs into the lungs every 6 (six) hours as needed for wheezing or shortness of breath. 1 each Rodriguez-Southworth, Nettie Elm, PA-C   azithromycin (ZITHROMAX Z-PAK) 250 MG tablet 2 today, then 0ne every day x 4 days 6 tablet Rodriguez-Southworth, Nettie Elm, PA-C      PDMP not reviewed this encounter.   Garey Ham, PA-C 06/19/23 2023

## 2023-06-19 NOTE — ED Triage Notes (Signed)
 Pt had covid 3 weeks ago. Pt still having cough that is getting up phlegm. Pt c/o back pains when cough. Taking Dayquil.
